# Patient Record
Sex: Female | Born: 1995 | Race: Black or African American | Hispanic: No | Marital: Single | Smoking: Never smoker
Health system: Southern US, Community
[De-identification: ages and names within clinical notes are randomized; demographics above are authoritative.]

## PROBLEM LIST (undated history)

## (undated) DIAGNOSIS — N946 Dysmenorrhea, unspecified: Secondary | ICD-10-CM

## (undated) DIAGNOSIS — T7840XA Allergy, unspecified, initial encounter: Secondary | ICD-10-CM

## (undated) HISTORY — PX: OTHER SURGICAL HISTORY: SHX169

## (undated) HISTORY — DX: Allergy, unspecified, initial encounter: T78.40XA

---

## 2015-07-06 ENCOUNTER — Emergency Department (HOSPITAL_COMMUNITY)
Admission: EM | Admit: 2015-07-06 | Discharge: 2015-07-07 | Disposition: A | Discharge status: Home / Self Care | Payer: BLUE CROSS/BLUE SHIELD | Attending: Emergency Medicine | Admitting: Emergency Medicine

## 2015-07-06 DIAGNOSIS — Z7952 Long term (current) use of systemic steroids: Secondary | ICD-10-CM | POA: Diagnosis not present

## 2015-07-06 DIAGNOSIS — L509 Urticaria, unspecified: Secondary | ICD-10-CM | POA: Diagnosis not present

## 2015-07-06 DIAGNOSIS — R220 Localized swelling, mass and lump, head: Secondary | ICD-10-CM | POA: Diagnosis present

## 2015-07-06 DIAGNOSIS — R079 Chest pain, unspecified: Secondary | ICD-10-CM | POA: Diagnosis not present

## 2015-07-07 ENCOUNTER — Encounter (HOSPITAL_COMMUNITY): Payer: Self-pay

## 2015-07-07 MED ORDER — FAMOTIDINE IN NACL 20-0.9 MG/50ML-% IV SOLN
20.0000 mg | Freq: Once | INTRAVENOUS | Status: AC
  Administered 2015-07-07: 20 mg via INTRAVENOUS
  Filled 2015-07-07: qty 50

## 2015-07-07 MED ORDER — PREDNISONE 20 MG PO TABS: ORAL_TABLET | ORAL | Status: DC

## 2015-07-07 MED ORDER — METHYLPREDNISOLONE SODIUM SUCC 125 MG IJ SOLR
125.0000 mg | Freq: Once | INTRAMUSCULAR | Status: AC
  Administered 2015-07-07: 125 mg via INTRAVENOUS
  Filled 2015-07-07: qty 2

## 2015-07-07 MED ORDER — SODIUM CHLORIDE 0.9 % IV SOLN
Freq: Once | INTRAVENOUS | Status: AC
  Administered 2015-07-07: 01:00:00 via INTRAVENOUS

## 2015-07-07 MED ORDER — DIPHENHYDRAMINE HCL 50 MG/ML IJ SOLN
50.0000 mg | Freq: Once | INTRAMUSCULAR | Status: AC
  Administered 2015-07-07: 50 mg via INTRAVENOUS
  Filled 2015-07-07: qty 1

## 2015-07-07 NOTE — ED Notes (Signed)
Bed: WA12 Expected date:  Expected time:  Means of arrival:  Comments: Triage 1  

## 2015-07-07 NOTE — Discharge Instructions (Signed)
Hives Hives are itchy, red, swollen areas of the skin. They can vary in size and location on your body. Hives can come and go for hours or several days (acute hives) or for several weeks (chronic hives). Hives do not spread from person to person (noncontagious). They may get worse with scratching, exercise, and emotional stress. CAUSES   Allergic reaction to food, additives, or drugs.  Infections, including the common cold.  Illness, such as vasculitis, lupus, or thyroid disease.  Exposure to sunlight, heat, or cold.  Exercise.  Stress.  Contact with chemicals. SYMPTOMS   Red or white swollen patches on the skin. The patches may change size, shape, and location quickly and repeatedly.  Itching.  Swelling of the hands, feet, and face. This may occur if hives develop deeper in the skin. DIAGNOSIS  Your caregiver can usually tell what is wrong by performing a physical exam. Skin or blood tests may also be done to determine the cause of your hives. In some cases, the cause cannot be determined. TREATMENT  Mild cases usually get better with medicines such as antihistamines. Severe cases may require an emergency epinephrine injection. If the cause of your hives is known, treatment includes avoiding that trigger.  HOME CARE INSTRUCTIONS   Avoid causes that trigger your hives.  Take antihistamines as directed by your caregiver to reduce the severity of your hives. Non-sedating or low-sedating antihistamines are usually recommended. Do not drive while taking an antihistamine.  Take any other medicines prescribed for itching as directed by your caregiver.  Wear loose-fitting clothing.  Keep all follow-up appointments as directed by your caregiver. SEEK MEDICAL CARE IF:   You have persistent or severe itching that is not relieved with medicine.  You have painful or swollen joints. SEEK IMMEDIATE MEDICAL CARE IF:   You have a fever.  Your tongue or lips are swollen.  You have  trouble breathing or swallowing.  You feel tightness in the throat or chest.  You have abdominal pain. These problems may be the first sign of a life-threatening allergic reaction. Call your local emergency services (911 in [place] MAKE SURE YOU:   Understand these instructions.  Will watch your condition.  Will get help right away if you are not doing well or get worse.   This information is not intended to replace advice given to you by your health care provider. Make sure you discuss any questions you have with your health care provider.   Document Released: 06/15/2005 Document Revised: 06/20/2013 Document Reviewed: 09/08/2011 Elsevier Interactive Patient Education 2016 Elsevier Inc.  

## 2015-07-07 NOTE — ED Provider Notes (Signed)
CSN: [id number]     Arrival date & time 07/06/15  2349 History  By signing my name below, I, Tracy Yang, attest that this documentation has been prepared under the direction and in the presence of Gilda Crease, MD. Electronically Signed: Bethel Yang, ED Scribe. 07/07/2015. 12:54 AM   Chief Complaint  Patient presents with  . Facial Swelling    The history is provided by the patient. No language interpreter was used.    Tracy Yang is a 20 y.o. female who presents to the Emergency Department complaining of atraumatic swelling at the left eye with onset yesterday. Associated symptoms include hives at the back and chest pain. She has very sensitive skin and notes frequent reactions. She used prednisone that she had from a previous episode without relief. Pt denies SOB.     History reviewed. No pertinent past medical history. History reviewed. No pertinent past surgical history. No family history on file. Social History  Substance Use Topics  . Smoking status: Never Smoker   . Smokeless tobacco: None  . Alcohol Use: Yes     Comment: socially   OB History    No data available     Review of Systems  Eyes:       Left eye swelling  Respiratory: Negative for choking and shortness of breath.   Cardiovascular: Positive for chest pain.  Skin: Positive for rash.  All other systems reviewed and are negative.  Allergies  Claritin  Home Medications   Prior to Admission medications   Medication Sig Start Date End Date Taking? Authorizing Provider  diphenhydrAMINE (BENADRYL) 25 mg capsule Take 25 mg by mouth every 6 (six) hours as needed for allergies.   Yes Historical Provider, MD  predniSONE (DELTASONE) 20 MG tablet Take 20 mg by mouth daily with breakfast.   Yes Historical Provider, MD   BP 127/75 mmHg  Pulse 85  Temp(Src) 98.1 F (36.7 C) (Oral)  Resp 15  SpO2 98%  LMP 07/07/2015 (Exact Date) Physical Exam  Constitutional: She is oriented to person, place,  and time. She appears well-developed and well-nourished. No distress.  HENT:  Head: Normocephalic and atraumatic.  Right Ear: Hearing normal.  Left Ear: Hearing normal.  Nose: Nose normal.  Mouth/Throat: Oropharynx is clear and moist and mucous membranes are normal.  Eyes: Conjunctivae and EOM are normal. Pupils are equal, round, and reactive to light.  Periorbital edema L>R  Neck: Normal range of motion. Neck supple.  Cardiovascular: Regular rhythm, S1 normal and S2 normal.  Exam reveals no gallop and no friction rub.   No murmur heard. Pulmonary/Chest: Effort normal and breath sounds normal. No respiratory distress. She exhibits no tenderness.  CTAB  Abdominal: Soft. Normal appearance and bowel sounds are normal. There is no hepatosplenomegaly. There is no tenderness. There is no rebound, no guarding, no tenderness at McBurney's point and negative Murphy's sign. No hernia.  Musculoskeletal: Normal range of motion.  Neurological: She is alert and oriented to person, place, and time. She has normal strength. No cranial nerve deficit or sensory deficit. Coordination normal. GCS eye subscore is 4. GCS verbal subscore is 5. GCS motor subscore is 6.  Skin: Skin is warm, dry and intact. Rash noted. No cyanosis.  Scattered urticaria on back  Psychiatric: She has a normal mood and affect. Her speech is normal and behavior is normal. Thought content normal.  Nursing note and vitals reviewed.   ED Course  Procedures (including critical care time) DIAGNOSTIC STUDIES: Oxygen Saturation  is 98% on RA,  normal by my interpretation.    COORDINATION OF CARE: 12:42 AM Discussed treatment plan which includes Pepcid, Benadryl, solu-medrol, and IVF with pt at bedside and pt agreed to plan.  Labs Review Labs Reviewed - No data to display  Imaging Review No results found.    EKG Interpretation None      MDM   Final diagnoses:  None   urticaria  Patient presents to the emergency department  for evaluation of rash and swelling. Patient reports that she has a history of recurrent urticaria of unclear etiology. She reports that she broke out in hives on her back and facial swelling earlier today. She took prednisone that she normally takes for this reaction but it did not help which is unusual. Patient did not have any throat swelling, tongue swelling, difficulty swallowing or shortness of breath.  Did reveal diffuse urticaria with periorbital edema, left greater than right. Oropharyngeal examination was unremarkable. Patient's oxygen saturations are normal. She is breathing comfortably. Patient medicated with improvement. It appears she is taking 20 mg of prednisone. She will be given 60 mg of prednisone as a taper, Benadryl as needed.  I personally performed the services described in this documentation, which was scribed in my presence. The recorded information has been reviewed and is accurate.     Gilda Creasehristopher J Pollina, MD 07/07/15 0230

## 2015-07-07 NOTE — ED Notes (Signed)
Patient states that left eye began to swell yesterday around 1100.  Patient denies itching, denies discharge, denies pain.  Patient denies injury to left eye.  Patient states that has very sensitive skin, [place] [facility] [place] [place].  Breathing even and unlabored.  NAD at this time.

## 2015-11-15 ENCOUNTER — Emergency Department (HOSPITAL_COMMUNITY)
Admission: EM | Admit: 2015-11-15 | Discharge: 2015-11-15 | Disposition: A | Discharge status: Home / Self Care | Payer: BLUE CROSS/BLUE SHIELD | Attending: Emergency Medicine | Admitting: Emergency Medicine

## 2015-11-15 ENCOUNTER — Encounter (HOSPITAL_COMMUNITY): Payer: Self-pay | Admitting: Emergency Medicine

## 2015-11-15 ENCOUNTER — Emergency Department (HOSPITAL_COMMUNITY): Payer: BLUE CROSS/BLUE SHIELD

## 2015-11-15 DIAGNOSIS — S91342A Puncture wound with foreign body, left foot, initial encounter: Secondary | ICD-10-CM | POA: Insufficient documentation

## 2015-11-15 DIAGNOSIS — Y9301 Activity, walking, marching and hiking: Secondary | ICD-10-CM | POA: Insufficient documentation

## 2015-11-15 DIAGNOSIS — Y929 Unspecified place or not applicable: Secondary | ICD-10-CM | POA: Insufficient documentation

## 2015-11-15 DIAGNOSIS — Z23 Encounter for immunization: Secondary | ICD-10-CM | POA: Insufficient documentation

## 2015-11-15 DIAGNOSIS — W228XXA Striking against or struck by other objects, initial encounter: Secondary | ICD-10-CM | POA: Insufficient documentation

## 2015-11-15 DIAGNOSIS — Y999 Unspecified external cause status: Secondary | ICD-10-CM | POA: Diagnosis not present

## 2015-11-15 DIAGNOSIS — Z7952 Long term (current) use of systemic steroids: Secondary | ICD-10-CM | POA: Diagnosis not present

## 2015-11-15 DIAGNOSIS — S90852A Superficial foreign body, left foot, initial encounter: Secondary | ICD-10-CM | POA: Diagnosis present

## 2015-11-15 DIAGNOSIS — S91332A Puncture wound without foreign body, left foot, initial encounter: Secondary | ICD-10-CM

## 2015-11-15 MED ORDER — CIPROFLOXACIN HCL 500 MG PO TABS: 500.0000 mg | ORAL_TABLET | Freq: Two times a day (BID) | ORAL | Status: DC

## 2015-11-15 MED ORDER — TETANUS-DIPHTH-ACELL PERTUSSIS 5-2.5-18.5 LF-MCG/0.5 IM SUSP
0.5000 mL | Freq: Once | INTRAMUSCULAR | Status: AC
  Administered 2015-11-15: 0.5 mL via INTRAMUSCULAR
  Filled 2015-11-15: qty 0.5

## 2015-11-15 NOTE — Discharge Instructions (Signed)
Keep wound clean with mild soap and water. Perform warm soapy water soaks in Half-strength hydrogen peroxide for 5-10 mins, twice daily x 5-7 days. Keep area covered with a topical antibiotic ointment and bandage, keep bandage dry, and wear the post-op shoe until the wound heals. Take antibiotics as directed. Ice and elevate for additional pain relief and swelling. Alternate between Ibuprofen and Tylenol for additional pain relief. Follow up with your primary care doctor or the [facility] in approximately 2-3 days for wound recheck and again in 1 week for recheck of symptoms. Monitor area for signs of infection to include, but not limited to: increasing pain, spreading redness, drainage/pus, worsening swelling, or fevers. Return to emergency department for emergent changing or worsening symptoms.   Puncture Wound A puncture wound is an injury that is caused by a sharp, thin object that goes through your skin, such as a nail. A puncture wound usually does not leave a large opening in your skin, so it may not bleed a lot. However, when you get a puncture wound, dirt or other materials (foreign bodies) can be forced into your wound and break off inside. This makes it more likely that an infection will happen, such as tetanus. HOME CARE Medicines  Take or apply over-the-counter and prescription medicines only as told by your doctor.  If you were prescribed an antibiotic medicine, take or apply it as told by your doctor. Do not stop using the antibiotic even if your condition starts to get better. Wound Care  There are many ways to close and cover a wound. For example, a wound can be covered with stitches (sutures), skin glue, or adhesive strips. Follow instructions from your doctor about:  How to take care of your wound.  When and how you should change your bandage (dressing).  When you should remove your bandage.  Removing whatever was used to close your wound.  Keep the  bandage dry as told by your doctor. Do not take baths, swim, use a hot tub, or do anything that would put your wound underwater until your doctor says it is okay.  Clean the wound as told by your doctor.  Do not scratch or pick at the wound.  Check your wound every day for signs of infection. Watch for:  Redness, swelling, or pain.  Fluid, blood, or pus. General Instructions  Raise (elevate) the injured area above the level of your heart while you are sitting or lying down.  If your puncture wound is in your foot, ask your doctor if you need to avoid putting weight on your foot and for how long.  Keep all follow-up visits as told by your doctor. This is important. GET HELP IF:  You got a tetanus shot and you have any of these problems at the injection site:  Swelling.  Very bad pain.  Redness.  Bleeding.  You have a fever.  Your stitches come out.  You notice a bad smell coming from your wound or your bandage.  You notice something coming out of the wound, such as wood or glass.  Medicine does not help your pain.  You have more redness, swelling, or pain at the site of your wound.  You have fluid, blood, or pus coming from your wound.  You notice a change in the color of your skin near your wound.  You need to change the bandage often because fluid, blood, or pus is coming from the wound.  You start to  have a new rash.  You start to have numbness around the wound. GET HELP RIGHT AWAY IF:  You have very bad swelling around the wound.  Your pain suddenly gets worse and is very bad.  You start to get painful skin lumps.  You have a red streak going away from your wound.  The wound is on your hand or foot and you cannot move a finger or toe like you usually can.  The wound is on your hand or foot and you notice that your fingers or toes look pale or bluish.   This information is not intended to replace advice given to you by your health care provider.  Make sure you discuss any questions you have with your health care provider.   Document Released: 03/24/2008 Document Revised: 03/06/2015 Document Reviewed: 08/08/2014 Elsevier Interactive Patient Education 2016 Elsevier Inc.  Wound Care Taking care of your wound properly can help to prevent pain and infection. It can also help your wound to heal more quickly.  HOW TO CARE FOR YOUR WOUND  Take or apply over-the-counter and prescription medicines only as told by your health care provider.  If you were prescribed antibiotic medicine, take or apply it as told by your health care provider. Do not stop using the antibiotic even if your condition improves.  Clean the wound each day or as told by your health care provider.  Wash the wound with mild soap and water.  Rinse the wound with water to remove all soap.  Pat the wound dry with a clean towel. Do not rub it.  There are many different ways to close and cover a wound. For example, a wound can be covered with stitches (sutures), skin glue, or adhesive strips. Follow instructions from your health care provider about:  How to take care of your wound.  When and how you should change your bandage (dressing).  When you should remove your dressing.  Removing whatever was used to close your wound.  Check your wound every day for signs of infection. Watch for:  Redness, swelling, or pain.  Fluid, blood, or pus.  Keep the dressing dry until your health care provider says it can be removed. Do not take baths, swim, use a hot tub, or do anything that would put your wound underwater until your health care provider approves.  Raise (elevate) the injured area above the level of your heart while you are sitting or lying down.  Do not scratch or pick at the wound.  Keep all follow-up visits as told by your health care provider. This is important. SEEK MEDICAL CARE IF:  You received a tetanus shot and you have swelling, severe pain,  redness, or bleeding at the injection site.  You have a fever.  Your pain is not controlled with medicine.  You have increased redness, swelling, or pain at the site of your wound.  You have fluid, blood, or pus coming from your wound.  You notice a bad smell coming from your wound or your dressing. SEEK IMMEDIATE MEDICAL CARE IF:  You have a red streak going away from your wound.   This information is not intended to replace advice given to you by your health care provider. Make sure you discuss any questions you have with your health care provider.   Document Released: 03/24/2008 Document Revised: 10/30/2014 Document Reviewed: 06/11/2014 Elsevier Interactive Patient Education [employer] Elsevier Inc.

## 2015-11-15 NOTE — ED Notes (Signed)
Pt transported POV with c/o foot pain due to nail stuck in bottom of left foot.  Pt walking outside of apartment when stepped on nail.  Construction work being done around area of apartment.

## 2015-11-15 NOTE — ED Provider Notes (Signed)
CSN: [id number]     Arrival date & time 11/15/15  1611 History  By signing my name below, I, Marisue Humble, attest that this documentation has been prepared under the direction and in the presence of non-physician practitioner, Keone Kamer Camprubi-Soms, PA-C. Electronically Signed: Marisue Humble, Scribe. 11/15/2015. 5:04 PM.   Chief Complaint  Patient presents with  . Foot Pain  . Foreign Body in Skin   Patient is a 20 y.o. female presenting with skin laceration. The history is provided by the patient. No language interpreter was used.  Laceration Location:  Foot Foot laceration location:  Sole of L foot Depth:  Cutaneous Quality comment:  Puncture Bleeding: controlled   Time since incident:  30 minutes Laceration mechanism:  Nail (screw) Pain details:    Quality:  Burning   Severity:  Moderate   Timing:  Constant   Progression:  Unchanged Foreign body present:  Metal Relieved by:  None tried Worsened by:  Movement Ineffective treatments:  None tried Tetanus status:  Out of date  HPI Comments:  Tracy Yang is a 20 y.o. female who presents to the Emergency Department complaining of 6/10 constant, stinging non-radiating pain in the bottom of her left foot after stepping on a screw ~30 minutes ago. The screw is still stuck in her shoe and into her foot. Pain is worse with movement. No alleviating factors noted or treatments attempted PTA. Tetanus out of date, last tetanus was in 5th grade (~75yrs ago). NKDA. Denies numbness, tingling, weakness, chest pain, shortness of breath, abdominal pain, nausea, vomiting, dysuria, hematuria, or rashes. No other symptoms reported.  History reviewed. No pertinent past medical history. Past Surgical History  Procedure Laterality Date  . Tooth removal     History reviewed. No pertinent family history. Social History  Substance Use Topics  . Smoking status: Never Smoker   . Smokeless tobacco: Never Used  . Alcohol Use: Yes     Comment:  socially   OB History    No data available     Review of Systems  Respiratory: Negative for shortness of breath.   Cardiovascular: Negative for chest pain.  Gastrointestinal: Negative for nausea, vomiting and abdominal pain.  Genitourinary: Negative for dysuria and hematuria.  Musculoskeletal: Positive for arthralgias. Negative for myalgias and joint swelling.  Skin: Positive for wound. Negative for color change.  Allergic/Immunologic: Negative for immunocompromised state.  Neurological: Negative for weakness and numbness.  Hematological: Does not bruise/bleed easily.  Psychiatric/Behavioral: Negative for confusion.  10 Systems reviewed and all are negative for acute change except as noted in the HPI.  Allergies  Claritin  Home Medications   Prior to Admission medications   Medication Sig Start Date End Date Taking? Authorizing Provider  diphenhydrAMINE (BENADRYL) 25 mg capsule Take 25 mg by mouth every 6 (six) hours as needed for allergies.    Historical Provider, MD  predniSONE (DELTASONE) 20 MG tablet 3 tabs po daily x 3 days, then 2 tabs x 3 days, then 1.5 tabs x 3 days, then 1 tab x 3 days, then 0.5 tabs x 3 days 07/07/15   Gilda Crease, MD   BP 133/79 mmHg  Pulse 98  Temp(Src) 97.8 F (36.6 C) (Oral)  Resp 16  SpO2 100%  LMP 11/10/2015   Physical Exam  Constitutional: She is oriented to person, place, and time. Vital signs are normal. She appears well-developed and well-nourished.  Non-toxic appearance. No distress.  Afebrile, nontoxic, NAD  HENT:  Head: Normocephalic and atraumatic.  Mouth/Throat:  Mucous membranes are normal.  Eyes: Conjunctivae and EOM are normal. Right eye exhibits no discharge. Left eye exhibits no discharge.  Neck: Normal range of motion. Neck supple.  Cardiovascular: Normal rate and intact distal pulses.   Pulmonary/Chest: Effort normal. No respiratory distress.  Abdominal: Normal appearance. She exhibits no distension.   Musculoskeletal: Normal range of motion.       Left foot: There is tenderness and laceration. There is normal range of motion, no bony tenderness, no swelling and normal capillary refill.  Left foot with a screw in the shoe and entering into the plantar aspect of her foot just proximal to the third and 4th MTP joints, superficial puncture wound noted after screw was removed, minimal bleeding, no obvious retained FB's, no surrounding swelling or deformity, minimal tenderness to the puncture wound without any focal bony TTP, wiggles all digits, strength and sensation grossly intact, distal pulses intact, soft compartments   Neurological: She is alert and oriented to person, place, and time. She has normal strength. No sensory deficit.  Skin: Skin is warm and dry. Laceration noted. No rash noted.  Psychiatric: She has a normal mood and affect. Her behavior is normal.  Nursing note and vitals reviewed.   ED Course  Procedures  DIAGNOSTIC STUDIES:  Oxygen Saturation is 100% on RA, normal by my interpretation.    COORDINATION OF CARE:  4:52 PM Will x-ray left foot and update Tetanus. Discussed treatment plan with pt at bedside and pt agreed to plan.  Labs Review Labs Reviewed - No data to display  Imaging Review Dg Foot Complete Left  11/15/2015  CLINICAL DATA:  Stepped on nail EXAM: LEFT FOOT - COMPLETE 3+ VIEW COMPARISON:  None. FINDINGS: No fracture or dislocation is seen. The joint spaces are preserved. The visualized soft tissues are unremarkable. No radiopaque foreign body is seen. IMPRESSION: No fracture, dislocation, or radiopaque foreign body is seen. Electronically Signed   By: Charline Bills M.D.   On: 11/15/2015 17:42   I have personally reviewed and evaluated these images and lab results as part of my medical decision-making.   EKG Interpretation None      MDM   Final diagnoses:  Puncture wound of foot, left, initial encounter    20 y.o. female here with puncture  wound to L foot. Screw came out while trying to cut her shoe off during triage, small fairly superficial puncture wound to plantar aspect of L foot. Minimal bleeding. No swelling or focal bony TTP. Will obtain xray to eval for retained FBs, and soak in iodine/saline solution. Will update tetanus and reassess after xray.  6:02 PM Xray without acute findings or retained FBs. Discussed RICE, tylenol/motrin, wound care, and will give post op shoe. Will start on cipro for prophylaxis. F/up with PCP/[facility] in 3 days for wound recheck, and again in 1wk. I explained the diagnosis and have given explicit precautions to return to the ER including for any other new or worsening symptoms. The patient understands and accepts the medical plan as it's been dictated and I have answered their questions. Discharge instructions concerning home care and prescriptions have been given. The patient is STABLE and is discharged to home in good condition.   I personally performed the services described in this documentation, which was scribed in my presence. The recorded information has been reviewed and is accurate.  BP 133/79 mmHg  Pulse 98  Temp(Src) 97.8 F (36.6 C) (Oral)  Resp 16  SpO2 100%  LMP 11/10/2015  Meds ordered this encounter  Medications  . Tdap (BOOSTRIX) injection 0.5 mL    Sig:   . ciprofloxacin (CIPRO) 500 MG tablet    Sig: Take 1 tablet (500 mg total) by mouth 2 (two) times daily. One po bid x 7 days    Dispense:  14 tablet    Refill:  0    Order Specific Question:  Supervising Provider    Answer:  Eber HongMILLER, BRIAN [[id number]]      Shamal Stracener Camprubi-Soms, PA-C 11/15/15 1805

## 2016-01-09 NOTE — ED Provider Notes (Signed)
Medical screening examination/treatment/procedure(s) were performed by non-physician practitioner and as supervising physician I was immediately available for consultation/collaboration.   EKG Interpretation None       Jacalyn LefevreJulie Ziomara Birenbaum, MD 01/09/16 1433

## 2016-01-19 ENCOUNTER — Emergency Department (HOSPITAL_COMMUNITY)
Admission: EM | Admit: 2016-01-19 | Discharge: 2016-01-19 | Disposition: A | Discharge status: Home / Self Care | Payer: BLUE CROSS/BLUE SHIELD | Attending: Dermatology | Admitting: Dermatology

## 2016-01-19 ENCOUNTER — Encounter (HOSPITAL_COMMUNITY): Payer: Self-pay

## 2016-01-19 DIAGNOSIS — R1030 Lower abdominal pain, unspecified: Secondary | ICD-10-CM | POA: Insufficient documentation

## 2016-01-19 DIAGNOSIS — Z5321 Procedure and treatment not carried out due to patient leaving prior to being seen by health care provider: Secondary | ICD-10-CM | POA: Insufficient documentation

## 2016-01-19 NOTE — ED Triage Notes (Signed)
Pt c/o lower abdominal cramping x 1 hour (1700).  Pain score 9/10.  Denies n/v/d.  Pt reports that she is "spotting" and should be starting her period at anytime.

## 2016-01-19 NOTE — ED Notes (Signed)
Pt stated she did not want to wait in the lobby so she was going home.

## 2016-03-11 ENCOUNTER — Emergency Department (HOSPITAL_COMMUNITY): Payer: BLUE CROSS/BLUE SHIELD

## 2016-03-11 ENCOUNTER — Emergency Department (HOSPITAL_COMMUNITY)
Admission: EM | Admit: 2016-03-11 | Discharge: 2016-03-11 | Disposition: A | Discharge status: Home / Self Care | Payer: BLUE CROSS/BLUE SHIELD | Attending: Emergency Medicine | Admitting: Emergency Medicine

## 2016-03-11 ENCOUNTER — Encounter (HOSPITAL_COMMUNITY): Payer: Self-pay

## 2016-03-11 DIAGNOSIS — N946 Dysmenorrhea, unspecified: Secondary | ICD-10-CM | POA: Diagnosis not present

## 2016-03-11 DIAGNOSIS — Z79899 Other long term (current) drug therapy: Secondary | ICD-10-CM | POA: Diagnosis not present

## 2016-03-11 DIAGNOSIS — N83202 Unspecified ovarian cyst, left side: Secondary | ICD-10-CM | POA: Diagnosis not present

## 2016-03-11 DIAGNOSIS — R102 Pelvic and perineal pain: Secondary | ICD-10-CM

## 2016-03-11 DIAGNOSIS — R1030 Lower abdominal pain, unspecified: Secondary | ICD-10-CM | POA: Diagnosis present

## 2016-03-11 HISTORY — DX: Dysmenorrhea, unspecified: N94.6

## 2016-03-11 LAB — URINE MICROSCOPIC-ADD ON

## 2016-03-11 LAB — WET PREP, GENITAL
CLUE CELLS WET PREP: NONE SEEN
Sperm: NONE SEEN
TRICH WET PREP: NONE SEEN
YEAST WET PREP: NONE SEEN

## 2016-03-11 LAB — URINALYSIS, ROUTINE W REFLEX MICROSCOPIC
BILIRUBIN URINE: NEGATIVE
Glucose, UA: NEGATIVE mg/dL
KETONES UR: NEGATIVE mg/dL
NITRITE: NEGATIVE
PH: 5.5 (ref 5.0–8.0)
PROTEIN: NEGATIVE mg/dL
Specific Gravity, Urine: 1.021 (ref 1.005–1.030)

## 2016-03-11 LAB — RAPID HIV SCREEN (HIV 1/2 AB+AG)
HIV 1/2 Antibodies: NONREACTIVE
HIV-1 P24 ANTIGEN - HIV24: NONREACTIVE

## 2016-03-11 LAB — PREGNANCY, URINE: PREG TEST UR: NEGATIVE

## 2016-03-11 MED ORDER — KETOROLAC TROMETHAMINE 60 MG/2ML IM SOLN
60.0000 mg | Freq: Once | INTRAMUSCULAR | Status: AC
Start: 2016-03-11 — End: 2016-03-11
  Administered 2016-03-11: 60 mg via INTRAMUSCULAR
  Filled 2016-03-11: qty 2

## 2016-03-11 MED ORDER — NAPROXEN 500 MG PO TABS: 500.0000 mg | ORAL_TABLET | Freq: Two times a day (BID) | ORAL | 0 refills | Status: DC

## 2016-03-11 NOTE — ED Triage Notes (Signed)
She states she is having bad "period cramps".  She states she has hx of same. She also tells me she has seen gyn. Who advised her if pain persisted after Vicodin to go to [facility] "for x-rays".  She further states she has recently had Nuvo ring inserted.

## 2016-03-11 NOTE — ED Notes (Signed)
RN notified of lab results

## 2016-03-11 NOTE — Discharge Instructions (Signed)
Your pelvic pain and increase cramping is likely due to ovarian cyst.  You will need a repeat ultrasound of your pelvic in 1-2 weeks for further evaluation to ensure no other concerning changes that can cause complication of your condition.  Take Naproxen as needed for pain.  Return if you have any concerns.

## 2016-03-11 NOTE — ED Notes (Signed)
Pt taken to ultrasound

## 2016-03-11 NOTE — ED Provider Notes (Signed)
[facility]-EMERGENCY DEPT Provider Note   CSN: [id number] Arrival date & time: 03/11/16  1059     History   Chief Complaint Chief Complaint  Patient presents with  . Menstrual Problem    HPI Tracy Yang is a 20 y.o. female.  HPI   20 year old obese female with history of dysmenorrhea presenting with complaints of low abdominal cramping. Patient states she has irregular menstrual period. For the past 3 days she has had persistent vaginal bleeding along with excruciating lower abdominal cramping. She goes to 4 pads and tampons daily and report heavy bleeding with clots. Her abdominal cramping is intense, radiates to her back, worsening with palpation or with movement. No specific treatment tried. Patient report having 3 similar episodes of painful menstruation in the past without any specific diagnosis. She is sexually active with 3 separate sexual partners within the past 6 months using protection each time. She denies any associated fever, lightheadedness, dizziness, dysuria, hematuria, vaginal discharge, or foul odor. No prior pregnancy. No prior history of ovarian cyst or uterine fibroid.  Past Medical History:  Diagnosis Date  . Dysmenorrhea     There are no active problems to display for this patient.   Past Surgical History:  Procedure Laterality Date  . tooth removal      OB History    No data available       Home Medications    Prior to Admission medications   Medication Sig Start Date End Date Taking? Authorizing Provider  ciprofloxacin (CIPRO) 500 MG tablet Take 1 tablet (500 mg total) by mouth 2 (two) times daily. One po bid x 7 days Patient not taking: Reported on 03/11/2016 11/15/15   Mercedes Camprubi-Soms, PA-C  predniSONE (DELTASONE) 20 MG tablet 3 tabs po daily x 3 days, then 2 tabs x 3 days, then 1.5 tabs x 3 days, then 1 tab x 3 days, then 0.5 tabs x 3 days Patient not taking: Reported on 03/11/2016 07/07/15   Gilda Crease, MD    Family History No  family history on file.  Social History Social History  Substance Use Topics  . Smoking status: Never Smoker  . Smokeless tobacco: Never Used  . Alcohol use Yes     Comment: socially     Allergies   Claritin [loratadine]   Review of Systems Review of Systems  All other systems reviewed and are negative.    Physical Exam Updated Vital Signs BP 142/81 (BP Location: Right Arm)   Pulse 92   Temp 99.1 F (37.3 C) (Oral)   Resp 18   LMP 03/08/2016 (Exact Date)   SpO2 100%   Physical Exam  Constitutional: She appears well-developed and well-nourished. No distress.  Moderately obese female laying in bed nontoxic in appearance  HENT:  Head: Atraumatic.  Eyes: Conjunctivae are normal.  Neck: Neck supple.  Cardiovascular: Normal rate and regular rhythm.   Pulmonary/Chest: Effort normal and breath sounds normal.  Abdominal: Soft. There is tenderness (Mild suprapubic tenderness on palpation without guarding or rebound tenderness.).  Genitourinary:  Genitourinary Comments: Chaperone present during exam. No abnormal lymphadenopathy or inguinal hernia noted. Normal external genitalia. Minimal discomfort with speculum insertion. Blood noted in vaginal vault without obvious clots. Close cervical os. On bimanual examination, right adnexal tenderness without cervical motion tenderness.  Neurological: She is alert.  Skin: No rash noted.  Psychiatric: She has a normal mood and affect.  Nursing note and vitals reviewed.    ED Treatments / Results  Labs (all labs  ordered are listed, but only abnormal results are displayed) Labs Reviewed  WET PREP, GENITAL - Abnormal; Notable for the following:       Result Value   WBC, Wet Prep HPF POC FEW (*)    All other components within normal limits  URINALYSIS, ROUTINE W REFLEX MICROSCOPIC (NOT AT [facility]) - Abnormal; Notable for the following:    Hgb urine dipstick LARGE (*)    Leukocytes, UA TRACE (*)    All other components within normal  limits  URINE MICROSCOPIC-ADD ON - Abnormal; Notable for the following:    Squamous Epithelial / LPF 0-5 (*)    Bacteria, UA FEW (*)    All other components within normal limits  PREGNANCY, URINE  RAPID HIV SCREEN (HIV 1/2 AB+AG)  RPR  GC/CHLAMYDIA PROBE AMP ([place]) NOT AT [facility]    EKG  EKG Interpretation None       Radiology US Transvaginal Non-ob  Result Date: 03/11/2016 CLINICAL DATA:  Abdominal pain and cramping for 3 months EXAM: TRANSABDOMINAL AND TRANSVAGINAL ULTRASOUND OF PELVIS DOPPLER ULTRASOUND OF OVARIES TECHNIQUE: Both transabdominal and transvaginal ultrasound examinations of the pelvis were performed. Transabdominal technique was performed for global imaging of the pelvis including uterus, ovaries, adnexal regions, and pelvic cul-de-sac. It was necessary to proceed with endovaginal exam following the transabdominal exam to visualize the endometrium and ovaries. Color and duplex Doppler ultrasound was utilized to evaluate blood flow to the ovaries. COMPARISON:  None FINDINGS: Uterus Measurements: 6.9 x 3.7 x 4.2 cm. Normal morphology without mass. Endometrium Thickness: 11 mm thick. Heterogeneous and slightly irregular appearing focally thickened at the mid uterine segment versus fundus, cannot exclude mass or polyp. No endometrial fluid. Right ovary Measurements: 3.1 x 3.4 x 2.5 cm. Normal morphology without mass. Internal blood flow present on color Doppler imaging. Left ovary Measurements: 4.1 x 2.7 x 2.8 cm. Heterogeneous isoechoic to slightly hypoechoic nodule 2.7 x 1.8 x 1.7 cm question hemorrhagic cyst versus solid nodule. Internal blood flow present within LEFT ovary on color Doppler imaging. Pulsed Doppler evaluation of both ovaries demonstrates normal low-resistance arterial and venous waveforms. Other findings Small amount of nonspecific free pelvic fluid. No additional adnexal masses IMPRESSION: Question small hemorrhagic cyst versus solid nodule within LEFT ovary  ; followup ultrasound recommended after 1-2 menstrual cycles to reassess and exclude solid nodule. Heterogeneous slightly irregular endometrial complex which appears focally thicker at the mid uterine segment, unable to exclude endometrial polyp ; recommend assessment followup sonohysterography to exclude mass/polyp. Electronically Signed   By: Ulyses Southward M.D.   On: 03/11/2016 13:29   US Pelvis Complete  Result Date: 03/11/2016 CLINICAL DATA:  Abdominal pain and cramping for 3 months EXAM: TRANSABDOMINAL AND TRANSVAGINAL ULTRASOUND OF PELVIS DOPPLER ULTRASOUND OF OVARIES TECHNIQUE: Both transabdominal and transvaginal ultrasound examinations of the pelvis were performed. Transabdominal technique was performed for global imaging of the pelvis including uterus, ovaries, adnexal regions, and pelvic cul-de-sac. It was necessary to proceed with endovaginal exam following the transabdominal exam to visualize the endometrium and ovaries. Color and duplex Doppler ultrasound was utilized to evaluate blood flow to the ovaries. COMPARISON:  None FINDINGS: Uterus Measurements: 6.9 x 3.7 x 4.2 cm. Normal morphology without mass. Endometrium Thickness: 11 mm thick. Heterogeneous and slightly irregular appearing focally thickened at the mid uterine segment versus fundus, cannot exclude mass or polyp. No endometrial fluid. Right ovary Measurements: 3.1 x 3.4 x 2.5 cm. Normal morphology without mass. Internal blood flow present on color Doppler imaging. Left  ovary Measurements: 4.1 x 2.7 x 2.8 cm. Heterogeneous isoechoic to slightly hypoechoic nodule 2.7 x 1.8 x 1.7 cm question hemorrhagic cyst versus solid nodule. Internal blood flow present within LEFT ovary on color Doppler imaging. Pulsed Doppler evaluation of both ovaries demonstrates normal low-resistance arterial and venous waveforms. Other findings Small amount of nonspecific free pelvic fluid. No additional adnexal masses IMPRESSION: Question small hemorrhagic cyst  versus solid nodule within LEFT ovary ; followup ultrasound recommended after 1-2 menstrual cycles to reassess and exclude solid nodule. Heterogeneous slightly irregular endometrial complex which appears focally thicker at the mid uterine segment, unable to exclude endometrial polyp ; recommend assessment followup sonohysterography to exclude mass/polyp. Electronically Signed   By: Ulyses SouthwardMark  Boles M.D.   On: 03/11/2016 13:29   [place] Art/ven Flow Abd Pelv Doppler  Result Date: 03/11/2016 CLINICAL DATA:  Abdominal pain and cramping for 3 months EXAM: TRANSABDOMINAL AND TRANSVAGINAL ULTRASOUND OF PELVIS DOPPLER ULTRASOUND OF OVARIES TECHNIQUE: Both transabdominal and transvaginal ultrasound examinations of the pelvis were performed. Transabdominal technique was performed for global imaging of the pelvis including uterus, ovaries, adnexal regions, and pelvic cul-de-sac. It was necessary to proceed with endovaginal exam following the transabdominal exam to visualize the endometrium and ovaries. Color and duplex Doppler ultrasound was utilized to evaluate blood flow to the ovaries. COMPARISON:  None FINDINGS: Uterus Measurements: 6.9 x 3.7 x 4.2 cm. Normal morphology without mass. Endometrium Thickness: 11 mm thick. Heterogeneous and slightly irregular appearing focally thickened at the mid uterine segment versus fundus, cannot exclude mass or polyp. No endometrial fluid. Right ovary Measurements: 3.1 x 3.4 x 2.5 cm. Normal morphology without mass. Internal blood flow present on color Doppler imaging. Left ovary Measurements: 4.1 x 2.7 x 2.8 cm. Heterogeneous isoechoic to slightly hypoechoic nodule 2.7 x 1.8 x 1.7 cm question hemorrhagic cyst versus solid nodule. Internal blood flow present within LEFT ovary on color Doppler imaging. Pulsed Doppler evaluation of both ovaries demonstrates normal low-resistance arterial and venous waveforms. Other findings Small amount of nonspecific free pelvic fluid. No additional adnexal  masses IMPRESSION: Question small hemorrhagic cyst versus solid nodule within LEFT ovary ; followup ultrasound recommended after 1-2 menstrual cycles to reassess and exclude solid nodule. Heterogeneous slightly irregular endometrial complex which appears focally thicker at the mid uterine segment, unable to exclude endometrial polyp ; recommend assessment followup sonohysterography to exclude mass/polyp. Electronically Signed   By: Ulyses SouthwardMark  Boles M.D.   On: 03/11/2016 13:29    Procedures Procedures (including critical care time)  Medications Ordered in ED Medications - No data to display   Initial Impression / Assessment and Plan / ED Course  I have reviewed the triage vital signs and the nursing notes.  Pertinent labs & imaging results that were available during my care of the patient were reviewed by me and considered in my medical decision making (see chart for details).  Clinical Course    BP 120/83   Pulse 87   Temp 99.1 F (37.3 C) (Oral)   Resp 16   LMP 03/08/2016 (Exact Date)   SpO2 99%    Final Clinical Impressions(s) / ED Diagnoses   Final diagnoses:  Pelvic pain in female  Dysmenorrhea  Cyst of left ovary    New Prescriptions Discharge Medication List as of 03/11/2016  2:35 PM    START taking these medications   Details  naproxen (NAPROSYN) 500 MG tablet Take 1 tablet (500 mg total) by mouth 2 (two) times daily., Starting Wed 03/11/2016, Print  BP 142/81 (BP Location: Right Arm)   Pulse 92   Temp 99.1 F (37.3 C) (Oral)   Resp 18   LMP 03/08/2016 (Exact Date)   SpO2 100%   12:05 PM Patient here with painful menstrual period. Workup initiated.  Suspect ovarian cyst.  Will need to r/o ectopic pregnancy, TOA, torsion.  Doubt appy  2:35 PM Pt report improvement of sxs after toradol pain medication.  Wet prep result unremarkable.  UA without signs of UTI.  preg test negative.  Transvaginal US showing question small hemorrhagic cyst vs solid nodule within  L overay.  Heterogeneous irregular endometrial complex at mid uterine segment, unable to exclude endometrial polyps.  I discussed this finding with pt.  I suspect her pain is due to ovarian cyst.  I encourage pt to receive repeat US in 1-2 menstrual cycle at [facility] to r/o concerning features.        Fayrene Helper, PA-C 03/11/16 2037    Alvira Monday, MD 03/15/16 [phone]

## 2016-03-12 LAB — GC/CHLAMYDIA PROBE AMP (~~LOC~~) NOT AT ARMC
Chlamydia: NEGATIVE
Neisseria Gonorrhea: NEGATIVE

## 2016-03-12 LAB — RPR: RPR Ser Ql: NONREACTIVE

## 2016-04-07 ENCOUNTER — Emergency Department (HOSPITAL_COMMUNITY)
Admission: EM | Admit: 2016-04-07 | Discharge: 2016-04-08 | Disposition: A | Discharge status: Home / Self Care | Payer: BLUE CROSS/BLUE SHIELD | Attending: Emergency Medicine | Admitting: Emergency Medicine

## 2016-04-07 ENCOUNTER — Ambulatory Visit (HOSPITAL_COMMUNITY)
Admission: EM | Admit: 2016-04-07 | Discharge: 2016-04-07 | Disposition: A | Discharge status: Home / Self Care | Payer: No Typology Code available for payment source | Source: Ambulatory Visit | Attending: Emergency Medicine | Admitting: Emergency Medicine

## 2016-04-07 ENCOUNTER — Encounter (HOSPITAL_COMMUNITY): Payer: Self-pay | Admitting: Emergency Medicine

## 2016-04-07 DIAGNOSIS — Z0441 Encounter for examination and observation following alleged adult rape: Secondary | ICD-10-CM | POA: Diagnosis present

## 2016-04-07 DIAGNOSIS — Z79899 Other long term (current) drug therapy: Secondary | ICD-10-CM | POA: Insufficient documentation

## 2016-04-07 DIAGNOSIS — Z791 Long term (current) use of non-steroidal anti-inflammatories (NSAID): Secondary | ICD-10-CM | POA: Diagnosis not present

## 2016-04-07 DIAGNOSIS — T7421XA Adult sexual abuse, confirmed, initial encounter: Secondary | ICD-10-CM | POA: Insufficient documentation

## 2016-04-07 LAB — POC URINE PREG, ED: PREG TEST UR: NEGATIVE

## 2016-04-07 MED ORDER — CEFTRIAXONE SODIUM 250 MG IJ SOLR
250.0000 mg | Freq: Once | INTRAMUSCULAR | Status: AC
  Administered 2016-04-07: 250 mg via INTRAMUSCULAR

## 2016-04-07 MED ORDER — ULIPRISTAL ACETATE 30 MG PO TABS
30.0000 mg | ORAL_TABLET | Freq: Once | ORAL | Status: AC
  Administered 2016-04-07: 30 mg via ORAL

## 2016-04-07 MED ORDER — AZITHROMYCIN 250 MG PO TABS
1000.0000 mg | ORAL_TABLET | Freq: Once | ORAL | Status: AC
  Administered 2016-04-07: 1000 mg via ORAL

## 2016-04-07 MED ORDER — METRONIDAZOLE 500 MG PO TABS
2000.0000 mg | ORAL_TABLET | Freq: Once | ORAL | Status: AC
  Administered 2016-04-07: 2000 mg via ORAL

## 2016-04-07 MED ORDER — LIDOCAINE HCL (PF) 1 % IJ SOLN
0.9000 mL | Freq: Once | INTRAMUSCULAR | Status: AC
Start: 2016-04-07 — End: 2016-04-07
  Administered 2016-04-07: 0.9 mL
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
## 2016-04-07 NOTE — ED Notes (Signed)
Bed: WTR9 Expected date:  Expected time:  Means of arrival:  Comments: 

## 2016-04-07 NOTE — ED Notes (Signed)
[facility] nurse at bedside.
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
## 2017-04-08 ENCOUNTER — Encounter: Payer: Self-pay | Admitting: Family Medicine

## 2017-04-08 ENCOUNTER — Ambulatory Visit (INDEPENDENT_AMBULATORY_CARE_PROVIDER_SITE_OTHER): Payer: BLUE CROSS/BLUE SHIELD | Admitting: Family Medicine

## 2017-04-08 VITALS — BP 112/74 | HR 105 | Temp 99.2°F | Resp 18 | Ht 68.11 in | Wt 299.8 lb

## 2017-04-08 DIAGNOSIS — M546 Pain in thoracic spine: Secondary | ICD-10-CM

## 2017-04-08 DIAGNOSIS — G43009 Migraine without aura, not intractable, without status migrainosus: Secondary | ICD-10-CM

## 2017-04-08 MED ORDER — SUMATRIPTAN SUCCINATE 50 MG PO TABS: ORAL_TABLET | ORAL | 0 refills | Status: AC

## 2017-04-08 MED ORDER — PROMETHAZINE HCL 25 MG PO TABS: 25.0000 mg | ORAL_TABLET | Freq: Two times a day (BID) | ORAL | 0 refills | Status: AC | PRN

## 2017-04-08 MED ORDER — KETOROLAC TROMETHAMINE 60 MG/2ML IM SOLN
60.0000 mg | Freq: Once | INTRAMUSCULAR | Status: AC
  Administered 2017-04-08: 60 mg via INTRAMUSCULAR

## 2017-04-08 MED ORDER — DICLOFENAC SODIUM 75 MG PO TBEC: 75.0000 mg | DELAYED_RELEASE_TABLET | Freq: Two times a day (BID) | ORAL | 0 refills | Status: AC | PRN

## 2017-04-08 NOTE — Patient Instructions (Signed)
     IF you received an x-ray today, you will receive an invoice from [facility]. Please contact [facility] at [phone] with questions or concerns regarding your invoice.   IF you received labwork today, you will receive an invoice from [facility]. Please contact [facility] at [phone] with questions or concerns regarding your invoice.   Our billing staff will not be able to assist you with questions regarding bills from these companies.  You will be contacted with the lab results as soon as they are available. The fastest way to get your results is to activate your My Chart account. Instructions are located on the last page of this paperwork. If you have not heard from us regarding the results in 2 weeks, please contact this office.     
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
## 2018-08-18 IMAGING — US US PELVIS COMPLETE
1 series · 13 of 25 positions shown · non-contrast
Comparison: None

CLINICAL DATA: Abdominal pain and cramping for 3 months

EXAM:
TRANSABDOMINAL AND TRANSVAGINAL ULTRASOUND OF PELVIS
DOPPLER ULTRASOUND OF OVARIES
TECHNIQUE: Both transabdominal and transvaginal ultrasound examinations of the
pelvis were performed. Transabdominal technique was performed for
global imaging of the pelvis including uterus, ovaries, adnexal
regions, and pelvic cul-de-sac.
It was necessary to proceed with endovaginal exam following the
transabdominal exam to visualize the endometrium and ovaries. Color
and duplex Doppler ultrasound was utilized to evaluate blood flow to
the ovaries.

[Series 1: us pelvis complete · 0.24mm/px · 13 of 88 slices shown]
[im 1/88]
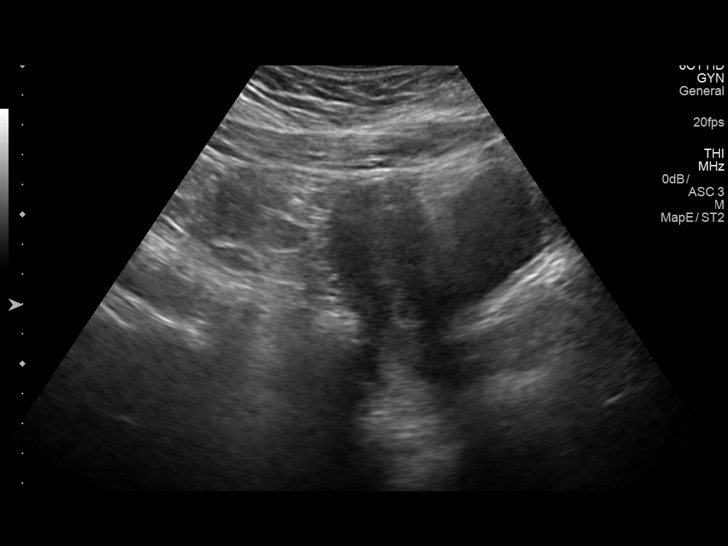
[im 8/88]
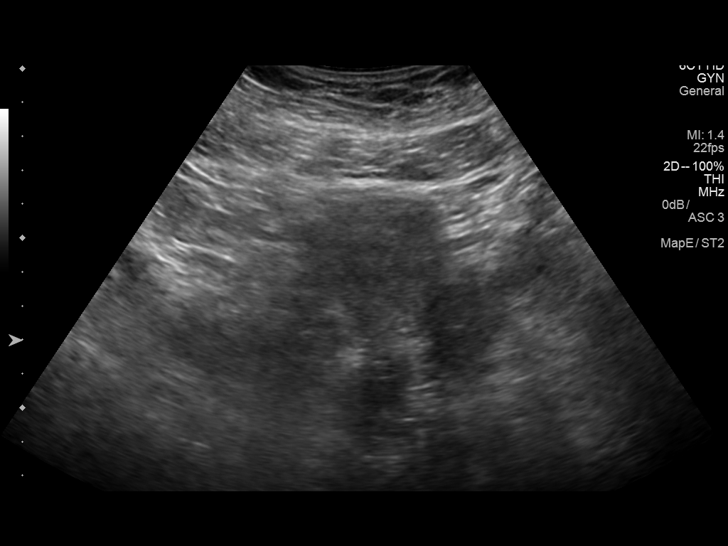
[im 15/88]
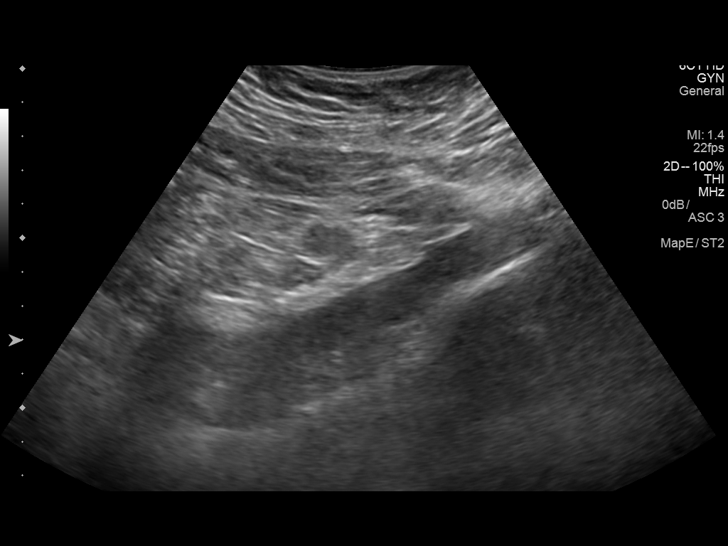
[im 22/88]
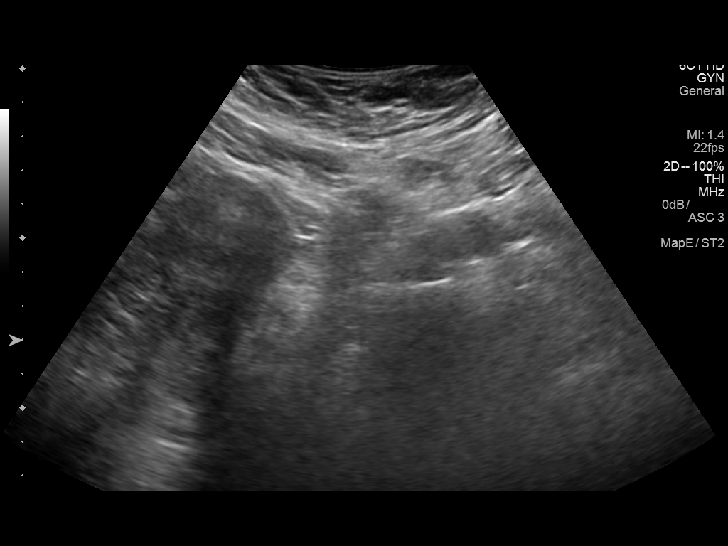
[im 30/88]
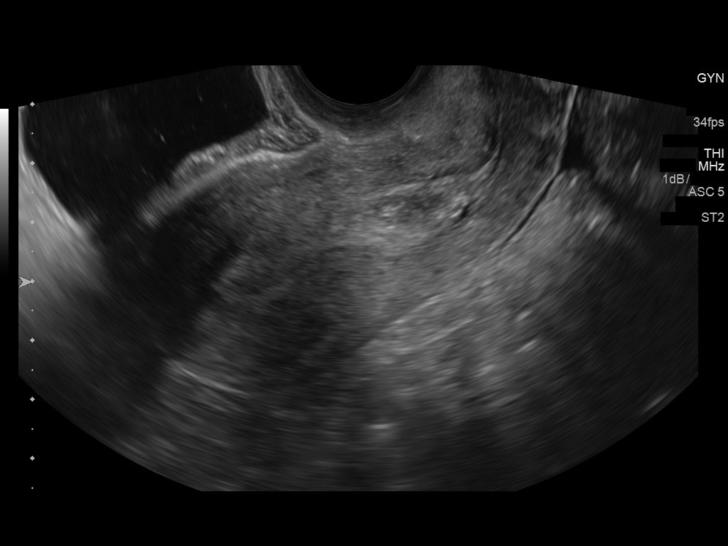
[im 37/88]
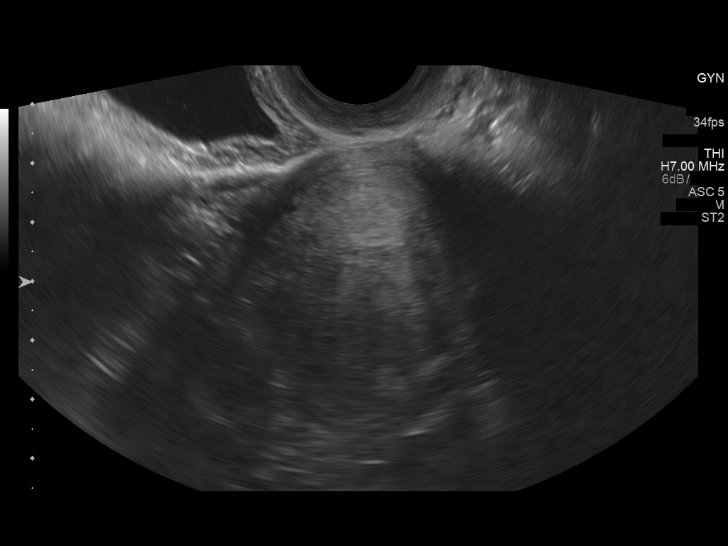
[im 44/88]
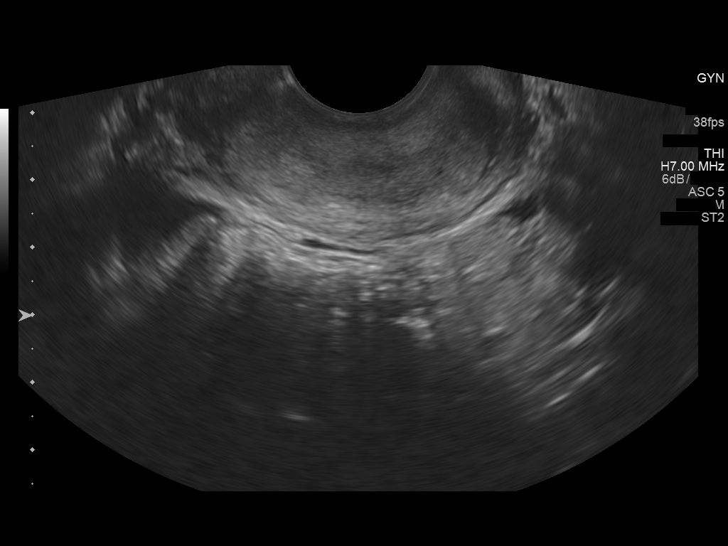
[im 51/88]
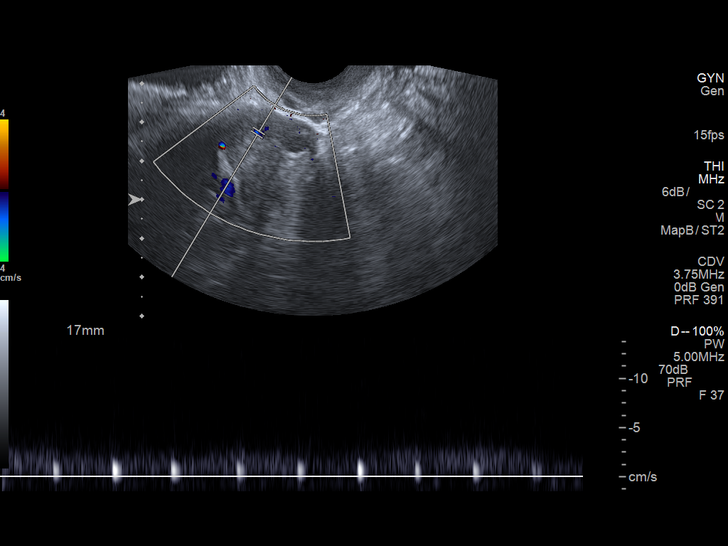
[im 59/88]
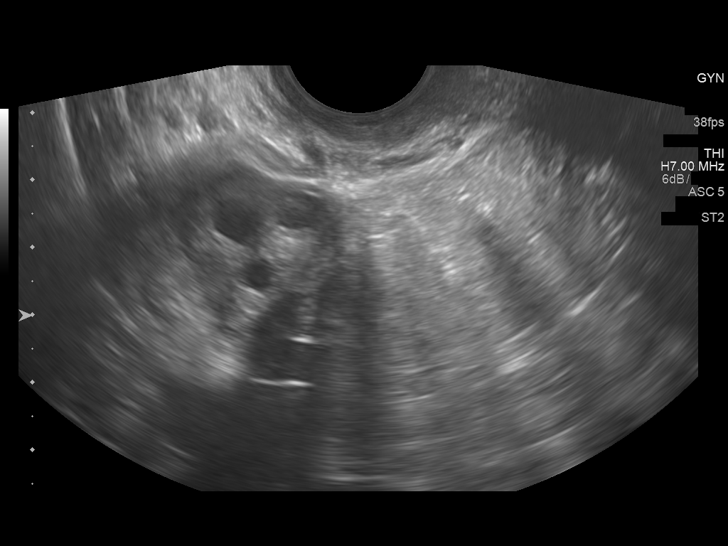
[im 66/88]
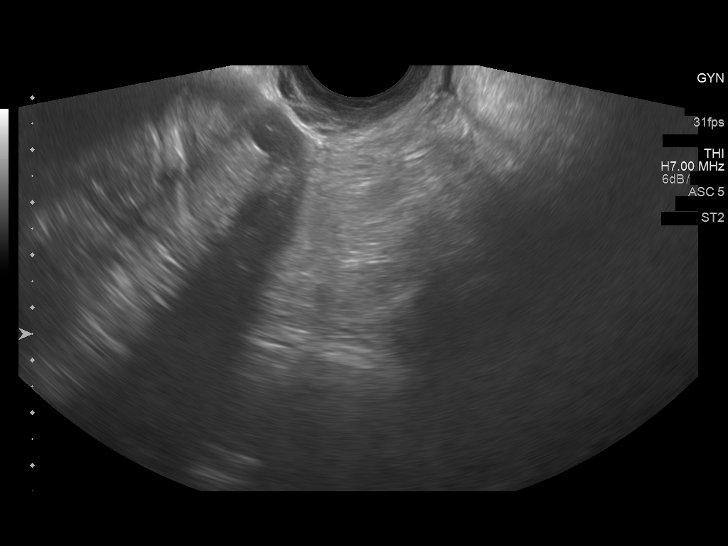
[im 73/88]
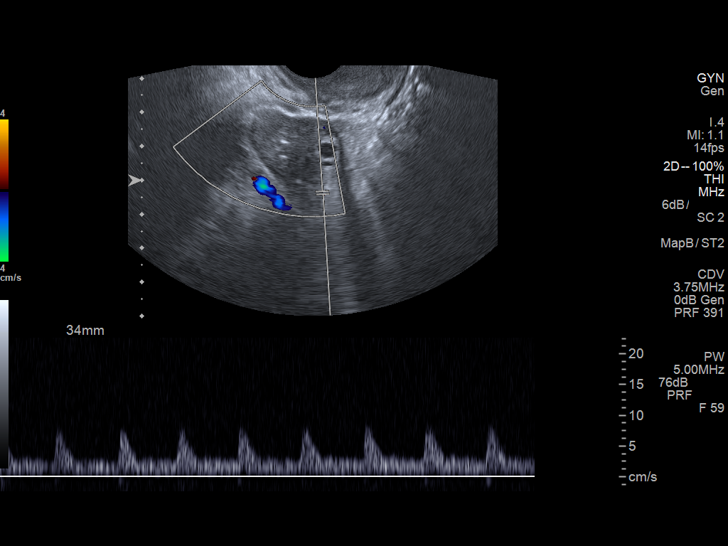
[im 80/88]
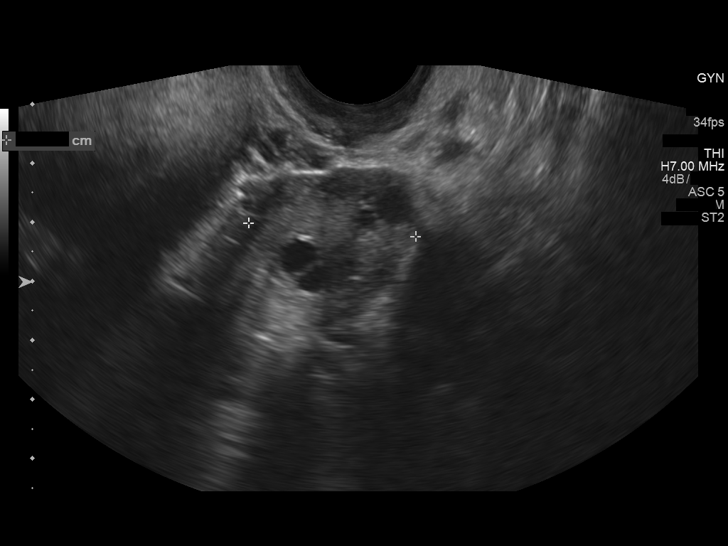
[im 88/88]
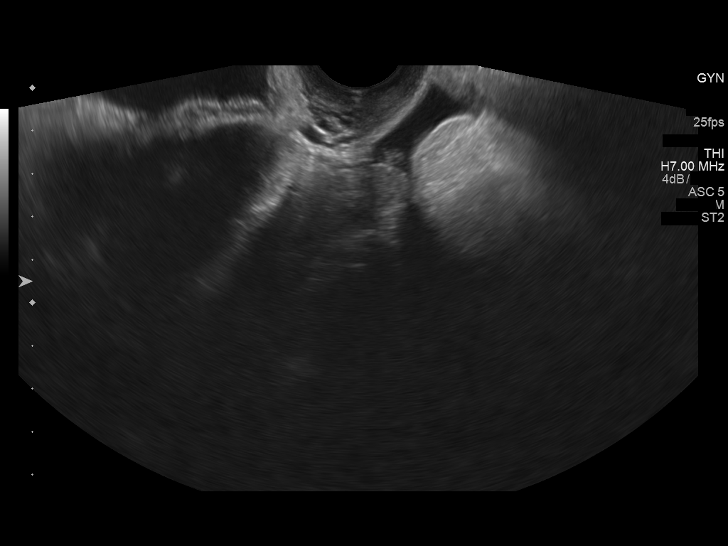

[13 of 25 positions shown; findings below may reference images not displayed]

FINDINGS: Uterus

Measurements: 6.9 x 3.7 x 4.2 cm. Normal morphology without mass.

Endometrium

Thickness: 11 mm thick. Heterogeneous and slightly irregular
appearing focally thickened at the mid uterine segment versus
fundus, cannot exclude mass or polyp. No endometrial fluid.

Right ovary

Measurements: 3.1 x 3.4 x 2.5 cm. Normal morphology without mass.
Internal blood flow present on color Doppler imaging.

Left ovary

Measurements: 4.1 x 2.7 x 2.8 cm. Heterogeneous isoechoic to
slightly hypoechoic nodule 2.7 x 1.8 x 1.7 cm question hemorrhagic
cyst versus solid nodule. Internal blood flow present within LEFT
ovary on color Doppler imaging.

Pulsed Doppler evaluation of both ovaries demonstrates normal
low-resistance arterial and venous waveforms.

Other findings

Small amount of nonspecific free pelvic fluid.

No additional adnexal masses
IMPRESSION: Question small hemorrhagic cyst versus solid nodule within LEFT
ovary ; followup ultrasound recommended after 1-2 menstrual cycles
to reassess and exclude solid nodule.

Heterogeneous slightly irregular endometrial complex which appears
focally thicker at the mid uterine segment, unable to exclude
endometrial polyp ; recommend assessment followup sonohysterography
to exclude mass/polyp.

## 2019-01-05 ENCOUNTER — Other Ambulatory Visit: Payer: Self-pay | Admitting: *Deleted

## 2019-01-05 DIAGNOSIS — Z20822 Contact with and (suspected) exposure to covid-19: Secondary | ICD-10-CM

## 2019-01-07 NOTE — Addendum Note (Signed)
Addended by: Akyra Bouchie M on: 01/07/2019 09:15 AM   Modules accepted: Orders
